# Patient Record
Sex: Male | Born: 2000 | Race: White | Hispanic: No | Marital: Single | Smoking: Never smoker
Health system: Southern US, Community
[De-identification: ages and names within clinical notes are randomized; demographics above are authoritative.]

---
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
## 2023-05-11 IMAGING — DX DG FOOT COMPLETE 3+V*R*
3 series · 3 of 3 positions shown · non-contrast
Comparison: None.

CLINICAL DATA: Right foot pain, right foot injury

EXAM:
RIGHT FOOT COMPLETE - 3+ VIEW

[foot ap]
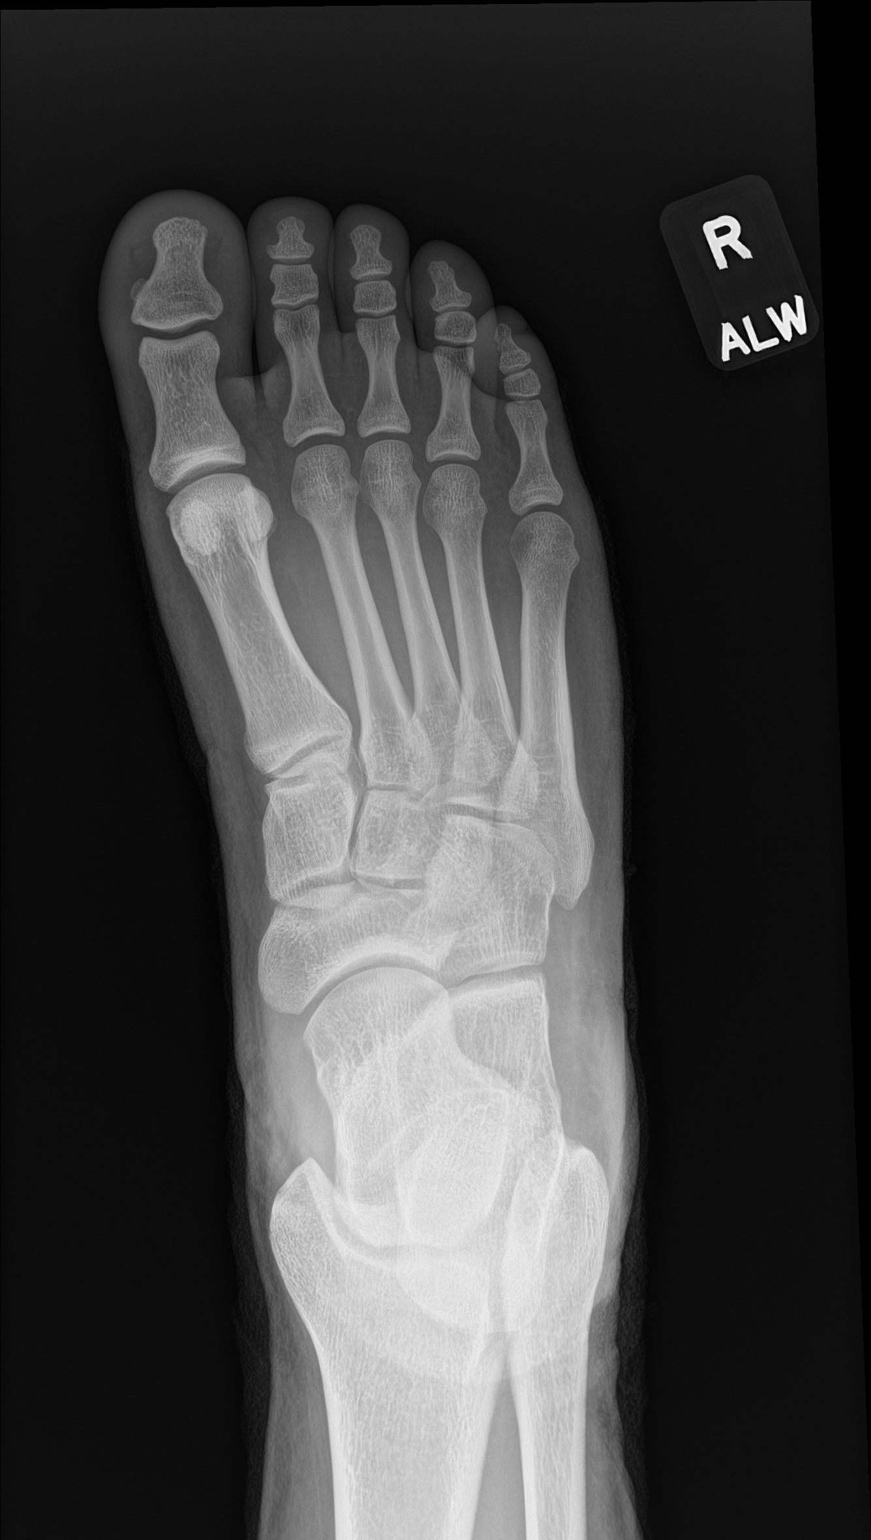

[foot obl]
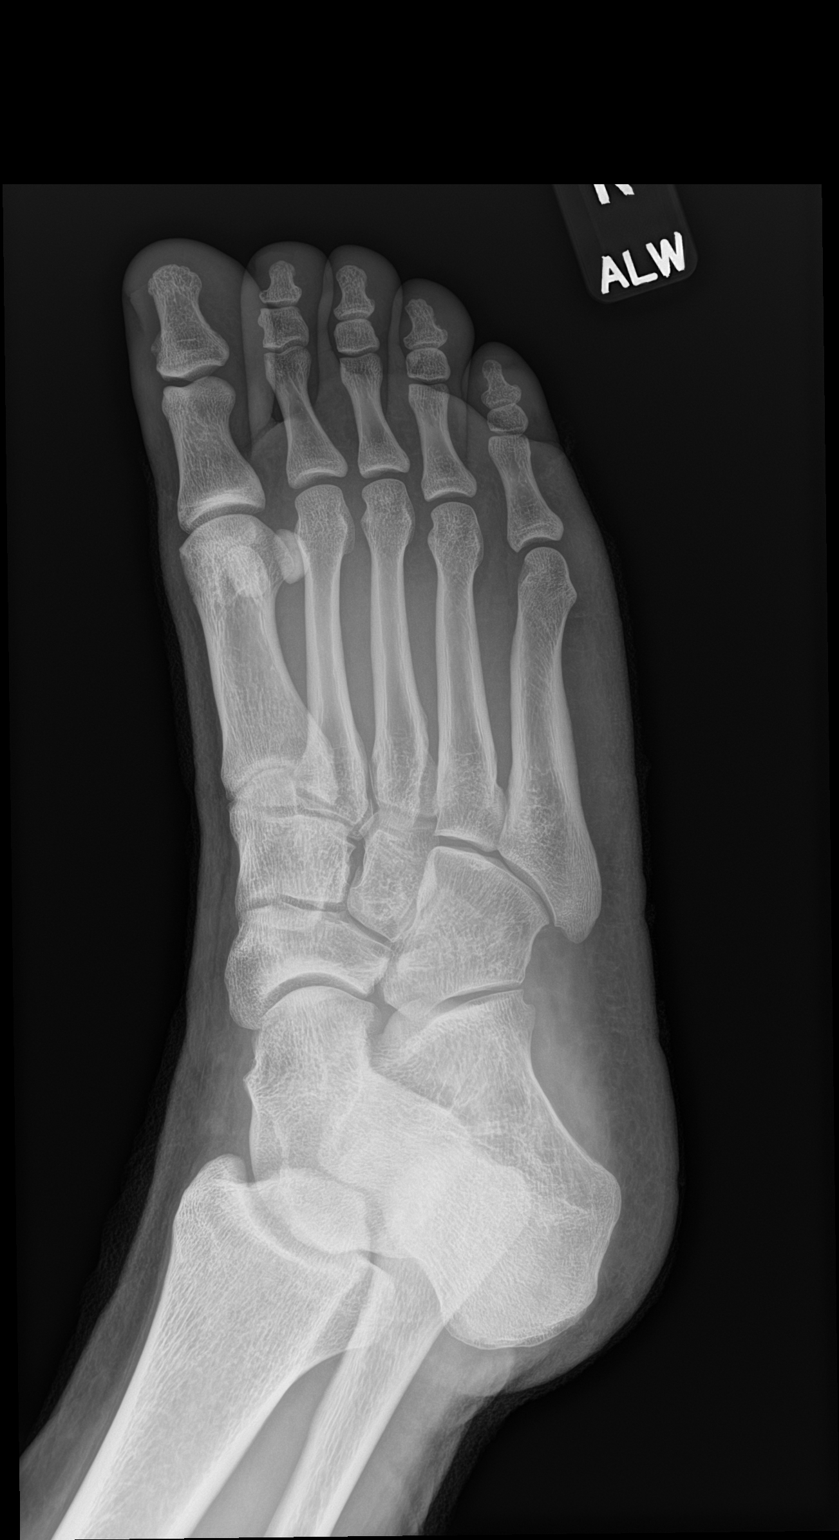

[foot lat]
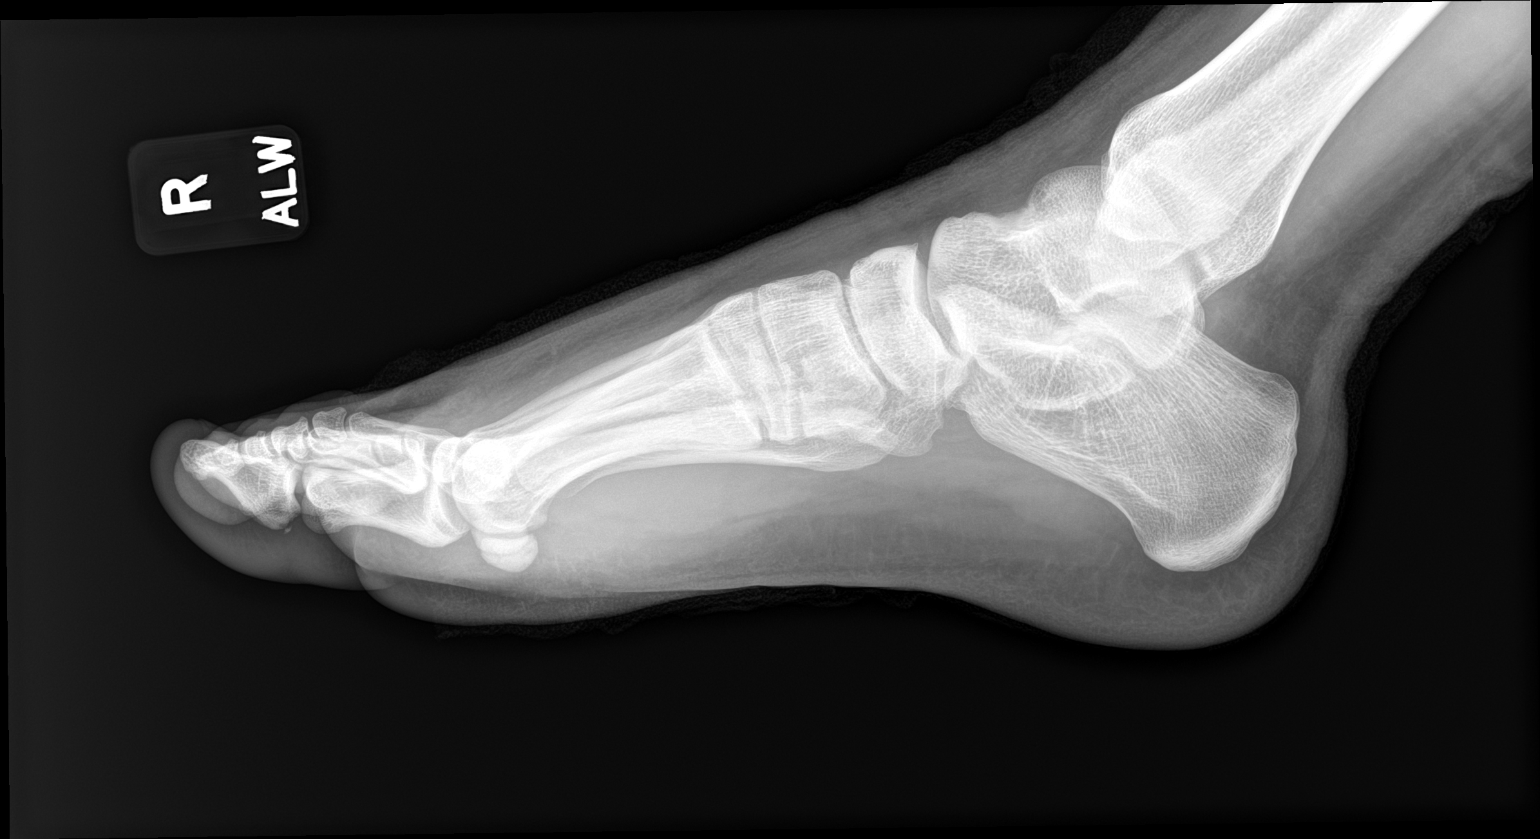

[3 of 3 positions shown; findings below may reference images not displayed]

FINDINGS: There is no evidence of fracture or dislocation. There is no
evidence of arthropathy or other focal bone abnormality. Soft
tissues are unremarkable.
IMPRESSION: Negative.
